# Patient Record
Sex: Male | Born: 1990 | Race: White | Hispanic: No | Marital: Single | Smoking: Never smoker
Health system: Southern US, Community
[De-identification: ages and names within clinical notes are randomized; demographics above are authoritative.]

## PROBLEM LIST (undated history)

## (undated) DIAGNOSIS — S62609A Fracture of unspecified phalanx of unspecified finger, initial encounter for closed fracture: Secondary | ICD-10-CM

## (undated) DIAGNOSIS — S82899A Other fracture of unspecified lower leg, initial encounter for closed fracture: Secondary | ICD-10-CM

## (undated) DIAGNOSIS — T7840XA Allergy, unspecified, initial encounter: Secondary | ICD-10-CM

## (undated) DIAGNOSIS — S060X9A Concussion with loss of consciousness of unspecified duration, initial encounter: Secondary | ICD-10-CM

## (undated) HISTORY — DX: Other fracture of unspecified lower leg, initial encounter for closed fracture: S82.899A

## (undated) HISTORY — DX: Fracture of unspecified phalanx of unspecified finger, initial encounter for closed fracture: S62.609A

## (undated) HISTORY — DX: Allergy, unspecified, initial encounter: T78.40XA

## (undated) HISTORY — DX: Concussion with loss of consciousness of unspecified duration, initial encounter: S06.0X9A

---
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
## 2012-06-18 ENCOUNTER — Ambulatory Visit (INDEPENDENT_AMBULATORY_CARE_PROVIDER_SITE_OTHER): Payer: BC Managed Care – PPO | Admitting: Family Medicine

## 2012-06-18 VITALS — BP 106/66 | HR 57 | Temp 97.9°F | Resp 16 | Ht 67.0 in | Wt 198.2 lb

## 2012-06-18 DIAGNOSIS — F988 Other specified behavioral and emotional disorders with onset usually occurring in childhood and adolescence: Secondary | ICD-10-CM

## 2012-06-18 NOTE — Progress Notes (Signed)
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

## 2012-09-15 IMAGING — CR DG FOOT 3+V UNILAT - NO REPORT
3 series · 3 of 3 positions shown · non-contrast
Comparison: none

[AP]
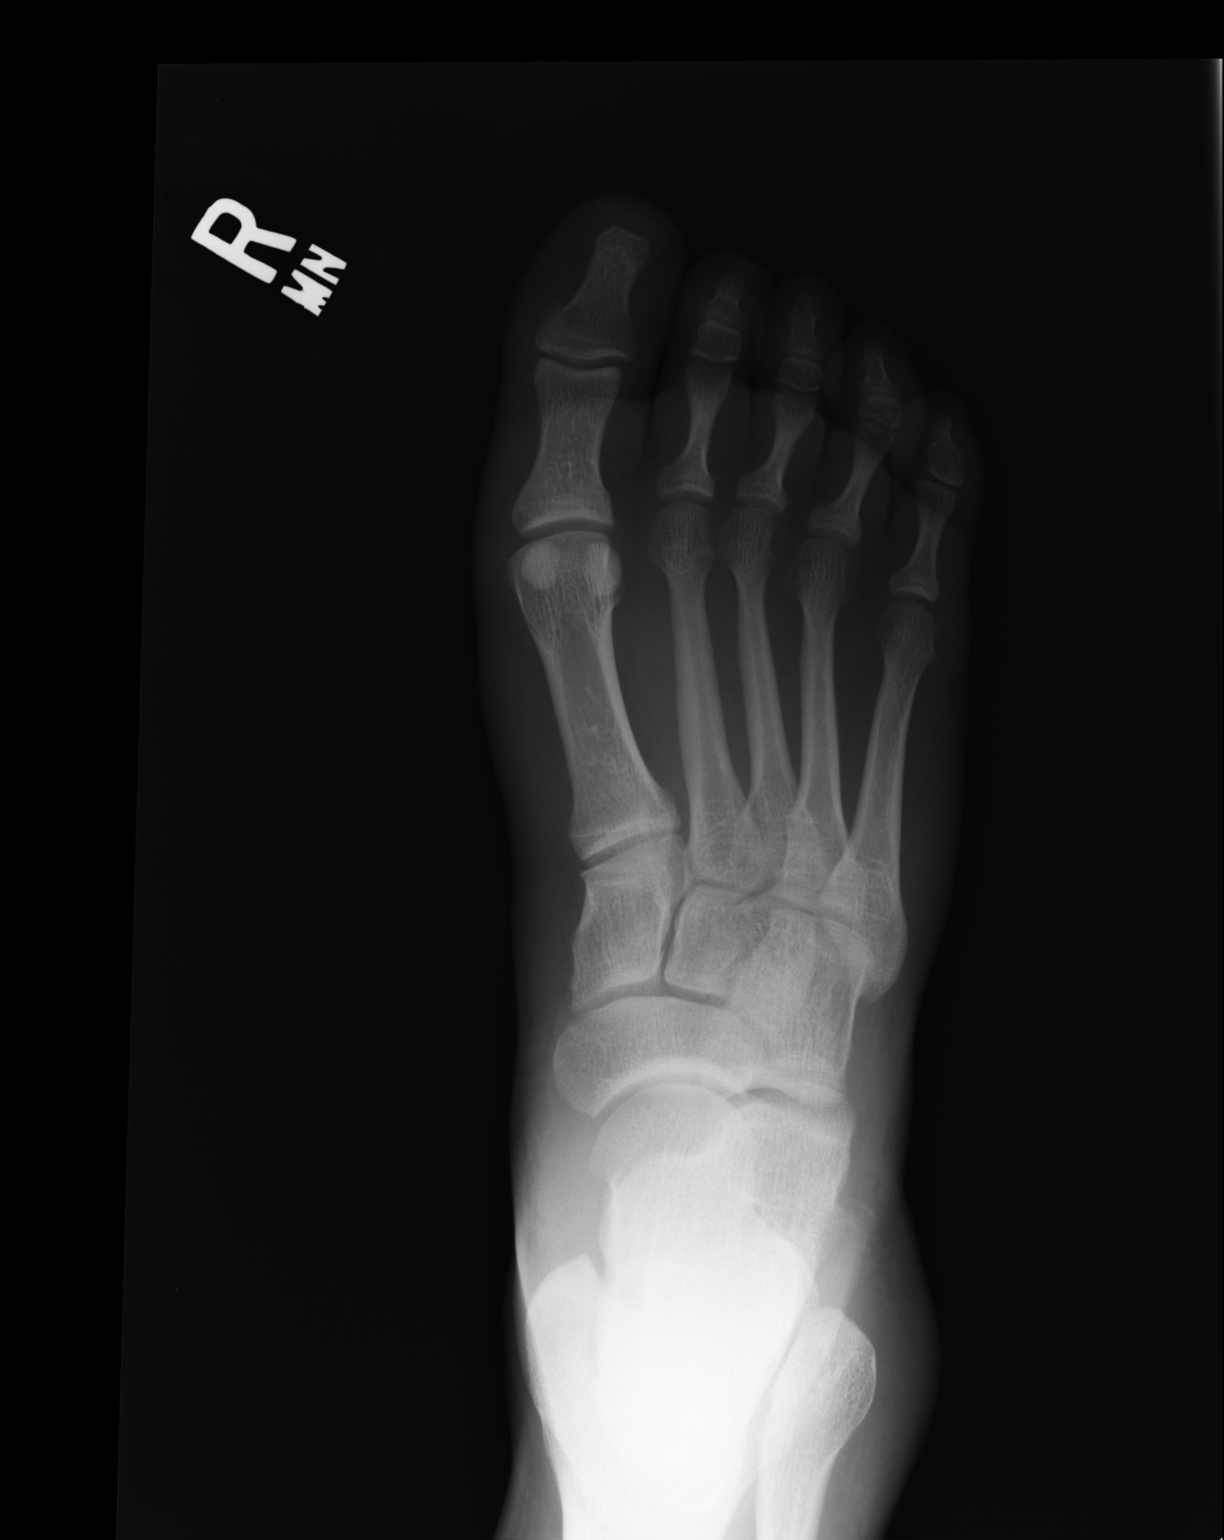

[ap obl int rot]
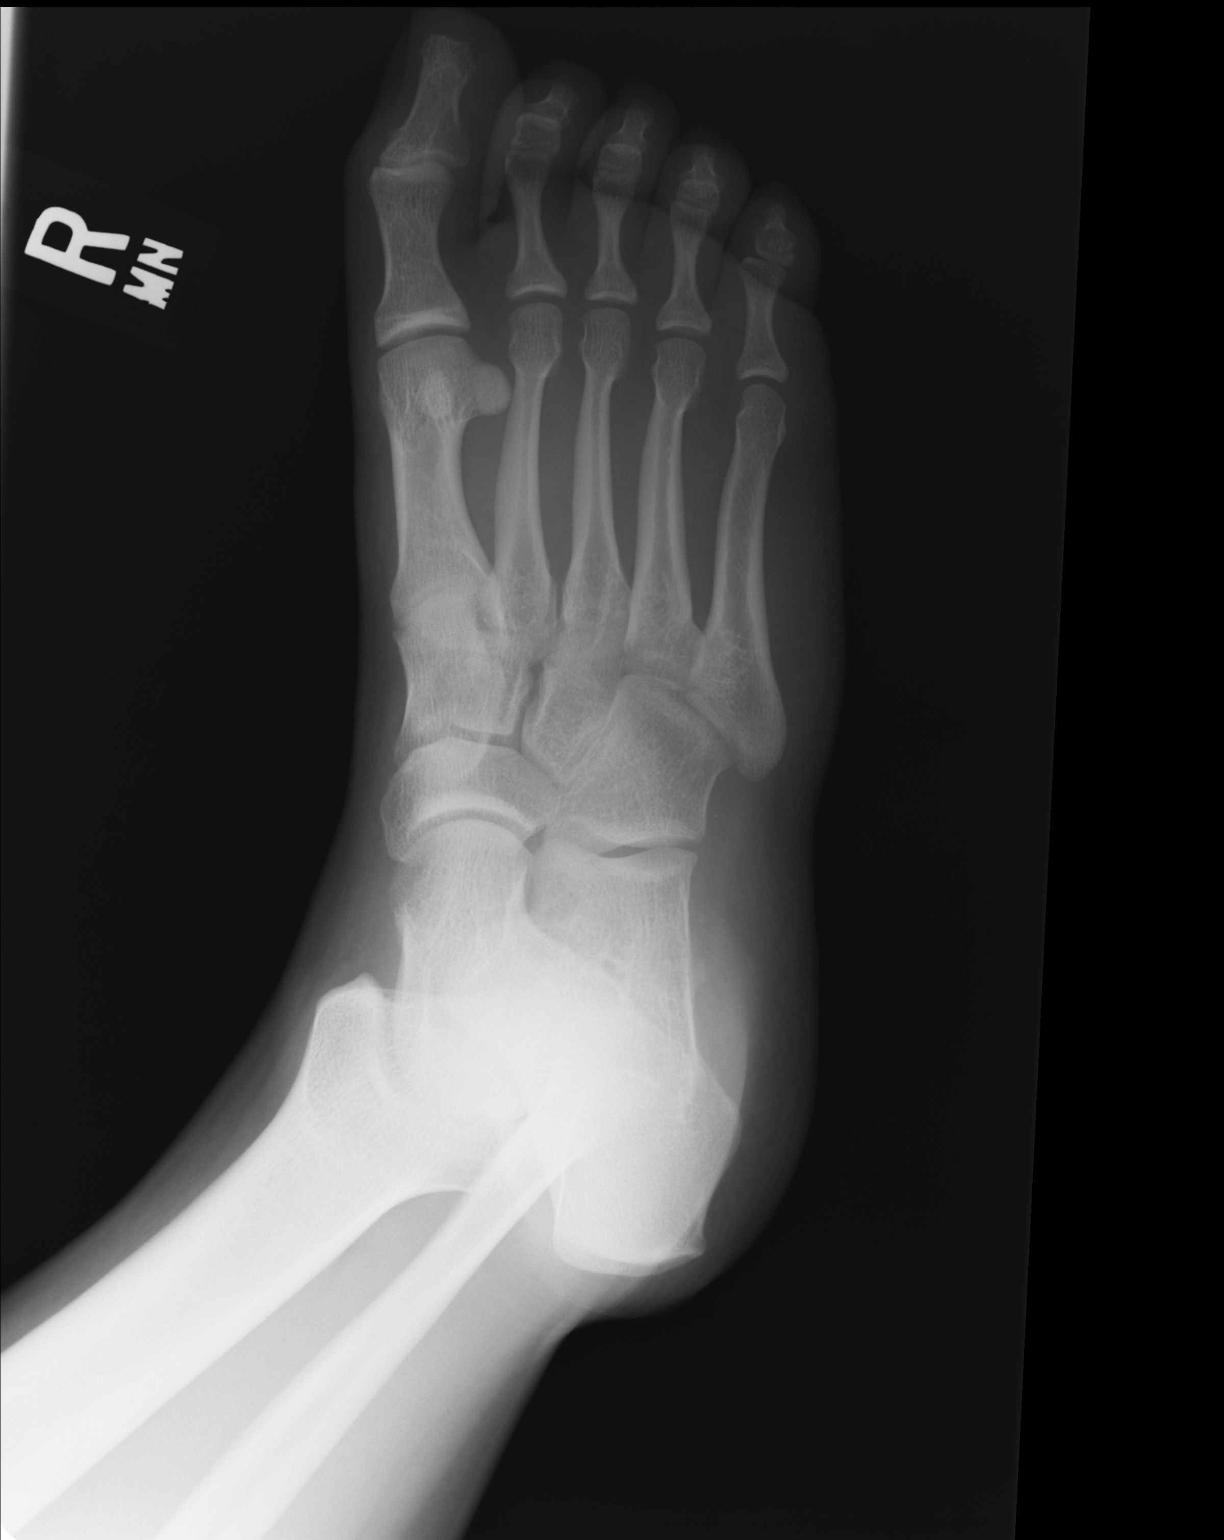

[lateral]
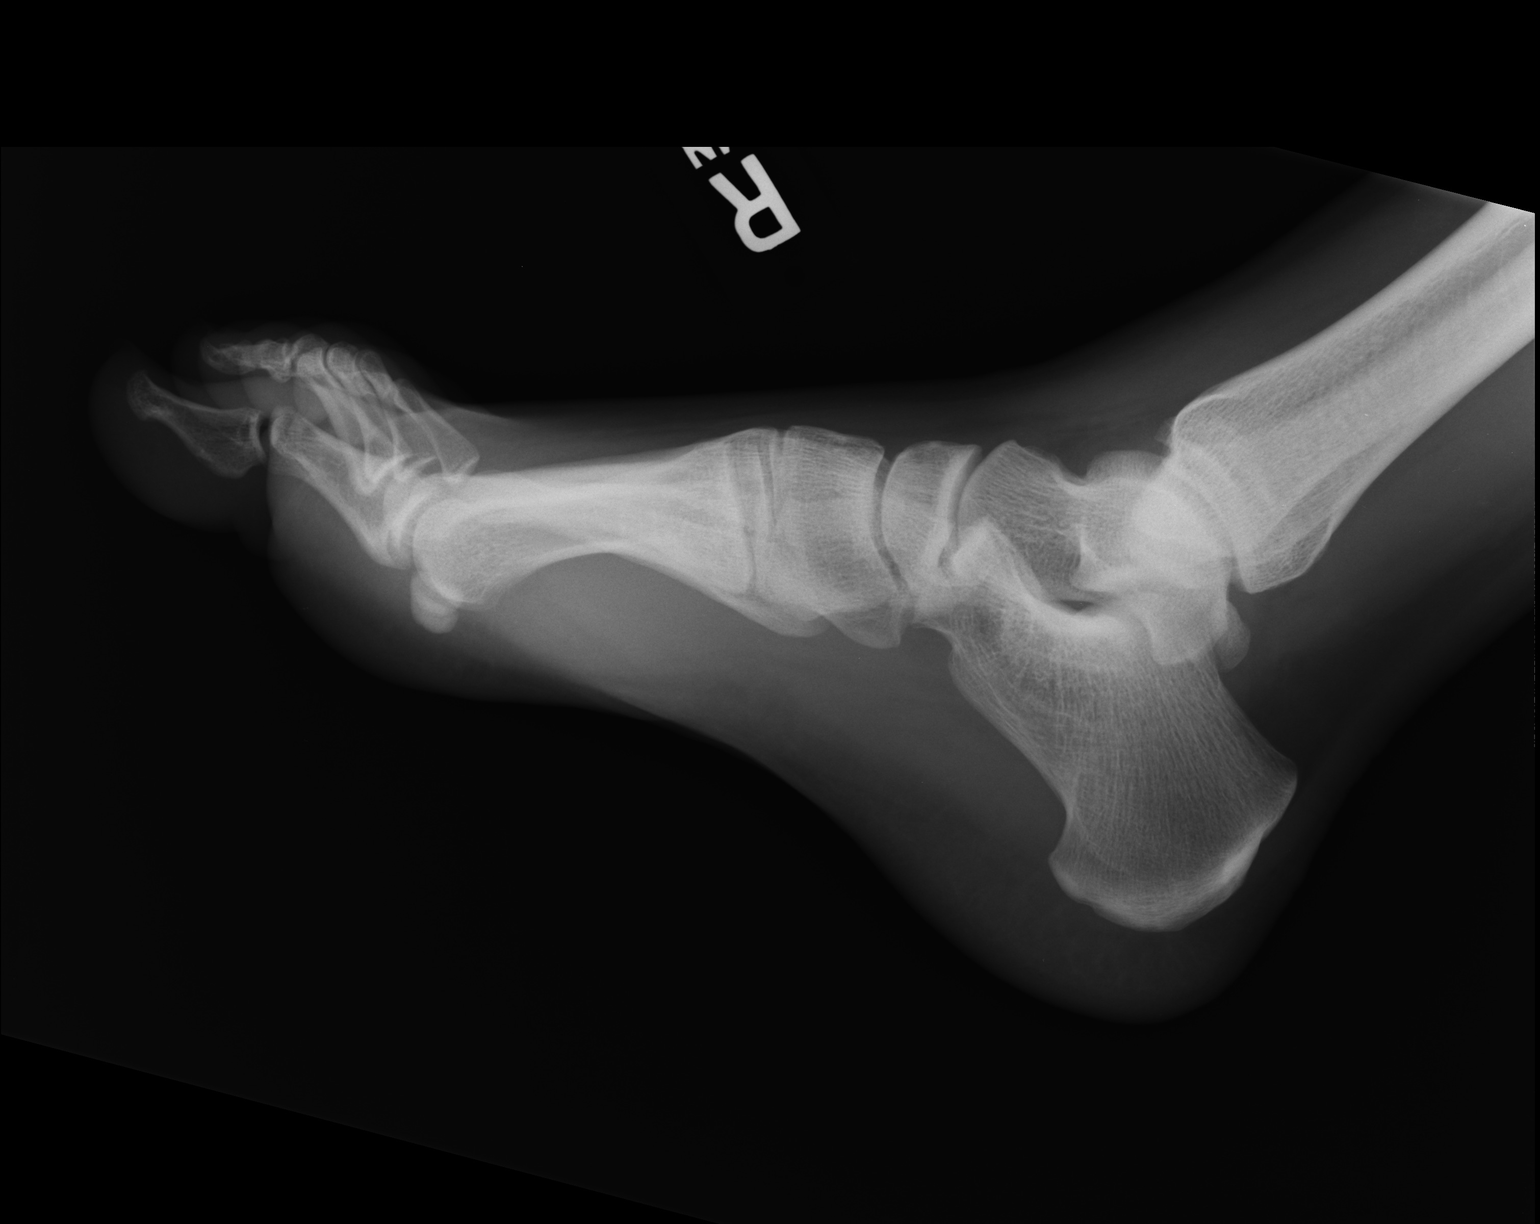

[3 of 3 positions shown; findings below may reference images not displayed]

Canned report from images found in remote index.

Refer to host system for actual result text.

## 2012-11-06 DIAGNOSIS — S62609A Fracture of unspecified phalanx of unspecified finger, initial encounter for closed fracture: Secondary | ICD-10-CM

## 2012-11-06 HISTORY — DX: Fracture of unspecified phalanx of unspecified finger, initial encounter for closed fracture: S62.609A

## 2017-05-14 ENCOUNTER — Encounter: Payer: Self-pay | Admitting: Family Medicine

## 2017-05-14 ENCOUNTER — Ambulatory Visit (INDEPENDENT_AMBULATORY_CARE_PROVIDER_SITE_OTHER): Payer: Managed Care, Other (non HMO) | Admitting: Family Medicine

## 2017-05-14 VITALS — BP 118/81 | HR 77 | Temp 98.2°F | Resp 16 | Ht 67.0 in | Wt 259.4 lb

## 2017-05-14 DIAGNOSIS — F4329 Adjustment disorder with other symptoms: Secondary | ICD-10-CM | POA: Diagnosis not present

## 2017-05-14 DIAGNOSIS — Z634 Disappearance and death of family member: Secondary | ICD-10-CM | POA: Diagnosis not present

## 2017-05-14 DIAGNOSIS — F4321 Adjustment disorder with depressed mood: Secondary | ICD-10-CM

## 2017-05-14 NOTE — Patient Instructions (Addendum)
Please call one of the numbers below for counseling as I think that most of your symptoms are due to adjustment disorder or with grief. [facility] another option for you or your family if you're interested. Additionally the employee assistance program through your work may also be an option if one is present. Disc golf once per week and some form of activity or exercise daily may also provide some benefit.   Follow-up with me in the next few weeks, sooner if any worsening of symptoms. If you're continuing to have difficulty with memory, focus, or concentration, we certainly can look into other testing or evaluation.     Karmen BongoAaron Stewart: [phone] [place] PedClaire Huprich: [phone]  Hospice: [email or web address]-[email or web address]-adults [phone]  Adjustment Disorder, Adult Adjustment disorder is a group of symptoms that can develop after a stressful life event, such as the loss of a job or serious physical illness. The symptoms can affect how you feel, think, and act. They may interfere with your relationships. Adjustment disorder increases your risk of suicide and substance abuse. If this disorder is not managed early, it can develop into a more serious condition, such as major depressive disorder or post-traumatic stress disorder. What are the causes? This condition happens when you have trouble recovering from or coping with a stressful life event. What increases the risk? You are more likely to develop this condition if:  You have had depression or anxiety.  You are being treated for a long-term (chronic) illness.  You are being treated for an illness that cannot be cured (terminal illness).  You have a family history of mental illness.  What are the signs or symptoms? Symptoms of this condition include:  Extreme trouble doing daily tasks, such as going to work.  Sadness, depression, or crying spells.  Worrying a lot.  Loss of enjoyment.  Change in appetite  or weight.  Feelings of loss or hopelessness.  Thoughts of suicide.  Anxiety, worry, or nervousness.  Trouble sleeping.  Avoiding family and friends.  Fighting or vandalism.  Complaining of feeling sick without being ill.  Feeling dazed or disconnected.  Nightmares.  Trouble sleeping.  Irritability.  Reckless driving.  Poor work International aid/development workerperformance.  Ignoring bills.  Symptoms of this condition start within three months of the stressful event. They do not last more than six months, unless the stressful circumstances last longer. Normal grieving after the death of a loved one is not a symptom of this condition. How is this diagnosed? To diagnose this condition, your health care provider will ask about what has happened in your life and how it has affected you. He or she may also ask about your medical history and your use of medicines, alcohol, and other substances. Your health care provider may do a physical exam and order lab tests or other studies. You may be referred to a mental health specialist. How is this treated? Treatment options for this condition include:  Counseling or talk therapy. Talk therapy is usually provided by mental health specialists.  Medicines. Certain medicines may help with depression, anxiety, and sleep.  Support groups. These offer emotional support, advice, and guidance. They are made up of people who have had similar experiences.  Observation and time. This is sometimes called "watchful waiting." In this treatment, health care providers monitor your health and behavior without other treatment. Adjustment disorder sometimes gets better on its own with time.  Follow these instructions at home:  Take over-the-counter and prescription medicines only as told by your health  care provider.  Keep all follow-up visits as told by your health care provider. This is important. Contact a health care provider if:  Your symptoms do not improve in six  months.  Your symptoms get worse. Get help right away if:  You have serious thoughts about hurting yourself or someone else. If you ever feel like you may hurt yourself or others, or have thoughts about taking your own life, get help right away. You can go to your nearest emergency department or call:  Your local emergency services (911 in the [place]  A suicide crisis helpline, such as the National Suicide Prevention Lifeline at 838-470-0687. This is open 24 hours a day.  Summary  Adjustment disorder is a group of symptoms that can develop after a stressful life event, such as the loss of a job or serious physical illness. The symptoms can affect how you feel, think, and act. They may interfere with your relationships.  Symptoms of this condition start within three months of the stressful event. They do not last more than six months, unless the stressful circumstances last longer.  Treatment may include talk therapy, medicines, participation in a support group, or observation to see if symptoms improve.  Contact your health care provider if your symptoms get worse or do not improve in six months.  If you ever feel like you may hurt yourself or others, or have thoughts about taking your own life, get help right away. This information is not intended to replace advice given to you by your health care provider. Make sure you discuss any questions you have with your health care provider. Document Released: 06/27/2006 Document Revised: 12/22/2016 Document Reviewed: 12/22/2016 Elsevier Interactive Patient Education  2018 [employer].   Complicated Grieving Grief is a normal response to the death of someone close to you. Feelings of fear, anger, and guilt can affect almost everyone who loses a loved one. It is also common to have symptoms of depression while you are grieving. These include problems with sleep, loss of appetite, and lack of energy. They may last for weeks or months after a  loss. Complicated grief is different from normal grief or depression. Normal grieving involves sadness and feelings of loss, but these feelings are not constant. Complicated grief is a constant and severe type of grief. It interferes with your ability to function normally. It may last for several months to a year or longer. Complicated grief may require treatment from a mental health care provider. What are the causes? It is not known why some people continue to struggle with grief and others do not. You may be at higher risk for complicated grief if:  The death of your loved one was sudden or unexpected.  The death of your loved one was due to a violent event.  Your loved one committed suicide.  Your loved one was a child or a young person.  You were very close to or dependent on the loved one.  You have a history of depression.  What are the signs or symptoms? Signs and symptoms of complicated grief may include:  Feeling disbelief or numbness.  Being unable to enjoy good memories of your loved one.  Needing to avoid anything that reminds you of your loved one.  Being unable to stop thinking about the death.  Feeling intense anger or guilt.  Feeling alone and hopeless.  Feeling that your life is meaningless and empty.  Losing the desire to live.  How is this diagnosed?  Your health care provider may diagnose complicated grief if:  You have constant symptoms of grief for 6-12 months or longer.  Your symptoms are interfering with your ability to live your life.  Your health care provider may want you to see a mental health care provider. Many symptoms of depression are similar to the symptoms of complicated grief. It is important to be evaluated for complicated grief along with other mental health conditions. How is this treated? Talk therapy with a mental health provider is the most common treatment for complicated grief. During therapy, you will learn healthy ways to cope  with the loss of your loved one. In some cases, your mental health care provider may also recommend antidepressant medicines. Follow these instructions at home:  Take care of yourself. ? Eat regular meals and maintain a healthy diet. Eat plenty of fruits, vegetables, and whole grains. ? Try to get some exercise each day. ? Keep regular hours for sleep. Try to get at least 8 hours of sleep each night.  Do not use drugs or alcohol to ease your symptoms.  Take medicines only as directed by your health care provider.  Spend time with friends and loved ones.  Consider joining a grief (bereavement) support group to help you deal with your loss.  Keep all follow-up visits as directed by your health care provider. This is important. Contact a health care provider if:  Your symptoms keep you from functioning normally.  Your symptoms do not get better with treatment. Get help right away if:  You have serious thoughts of hurting yourself or someone else.  You have suicidal feelings. This information is not intended to replace advice given to you by your health care provider. Make sure you discuss any questions you have with your health care provider. Document Released: 10/23/2005 Document Revised: 03/30/2016 Document Reviewed: 04/02/2014 Elsevier Interactive Patient Education  2018 [employer].   IF you received an x-ray today, you will receive an invoice from [facility]. Please contact [facility] at [phone] with questions or concerns regarding your invoice.   IF you received labwork today, you will receive an invoice from [facility]. Please contact [facility] at [phone] with questions or concerns regarding your invoice.   Our billing staff will not be able to assist you with questions regarding bills from these companies.  You will be contacted with the lab results as soon as they are available. The fastest way to get your results is to activate your My Chart  account. Instructions are located on the last page of this paperwork. If you have not heard from [place] regarding the results in 2 weeks, please contact this office.

## 2017-05-14 NOTE — Progress Notes (Addendum)
Subjective:  By signing my name below, I, Essence Howell, attest that this documentation has been prepared under the direction and in the presence of Wendie Agreste, MD Electronically Signed: Ladene Artist, ED Scribe 05/14/2017 at 5:45 PM.   Patient ID: Aaron Cox, male    DOB: [birth year], 26 y.o.   MRN: [id number]  Chief Complaint  Patient presents with  . Memory Loss    since the passing of his father and grandfather (08/2016 and 03/2017); reports losses of time, sometimes up to an hour  . Depression   HPI  Aaron Cox is a 26 y.o. male who presents to Primary Care at [facility] complaining of decreased concentration over the past 3 months. Pt states that he occasionally forgets what he wants to say in the middle of conversations. He reports increased stress and anxiety since the unexpected passing of his father in 08/2016 and grandfather in 03/2017. Pt states that he has never met with a grief counselor but is open to it. He reports some depression and SI for a few months in high school with occasional depression lasting up to 1 week after highschool. Pt states that he talked through suicidal thoughts in highschool with his girlfriend at the time and symptoms resolved. He denies SI/HI at this time. States he does not think he is truly depressed at this time, however, he states that he feels sad now only when speaking about the passing of his father. States he never had the chance to grieve his father's death since his grandfather became ill soon after and had to come live with him and his mother until he passed in May. Pt lives alone and does have firearms without ammunition in his home that he inherited from his grandfather. Denies sleep disturbances; reports 6-7 hours/night. Has been eating more recently and has noticed recent weight gain. He is not currently exercising. Denies increased alcohol consumption; 1 drink/month. Denies tobacco use or illicit drug use. Pt's mother is currently  taking anti-depressants and has mentioned meeting with a specialist as well. 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 He reports difficulty concentrating at work but does not suspect that his supervisors have noticed. He has adjusted/compensated by taking more notes. Pt is no longer taking Adderall; stopped in 2015. Had been doing fine with his concentration and focus prior to recent deaths as above. Pt denies slurred speech, weakness and any other focal neurologic symptoms.  Depression screen PHQ 2/9 05/14/2017  Decreased Interest 0  Down, Depressed, Hopeless 1  PHQ - 2 Score 1  Altered sleeping 0  Tired, decreased energy 0  Change in appetite 2  Feeling bad or failure about yourself  3  Trouble concentrating 3  Moving slowly or fidgety/restless 0  Suicidal thoughts 0  PHQ-9 Score 9  Difficult doing work/chores Very difficult   There are no active problems to display for this patient.  Past Medical History:  Diagnosis Date  . Allergy    seasonal  . Concussion    age 23  . Finger fracture, left 2014   pinkie finger, L  . Fracture, ankle 2013   No past surgical history on file. No Known Allergies Prior to Admission medications   Medication Sig Start Date End Date Taking? Authorizing Provider  amphetamine-dextroamphetamine (ADDERALL) 20 MG tablet Take 20 mg by mouth daily.    [provider]  amphetamine-dextroamphetamine (ADDERALL) 5 MG tablet Take 5 mg by mouth daily.    [provider]   Social History   Social History  . Marital status: Single    Spouse name: N/A  . Number of children: N/A  . Years of education: N/A   Occupational History  . Not on file.   Social History Main Topics  . Smoking status: Never Smoker  . Smokeless tobacco: Never Used  . Alcohol use Yes     Comment: occasional  . Drug use: No  . Sexual activity: Not on file   Other Topics Concern  . Not on file   Social History Narrative  . No narrative on file    Review of Systems  Constitutional: Positive for appetite change.  Neurological: Negative for speech difficulty and weakness.  Psychiatric/Behavioral: Positive for decreased concentration and dysphoric mood. Negative for sleep disturbance and suicidal ideas.      Objective:   Physical Exam  Constitutional: He is oriented to person, place, and time. He appears well-developed and well-nourished. No distress.  HENT:  Head: Normocephalic and atraumatic.  Eyes: Conjunctivae and EOM are normal.  Neck: Neck supple. No tracheal deviation present.  Cardiovascular: Normal rate.   Pulmonary/Chest: Effort normal. No respiratory distress.  Musculoskeletal: Normal range of motion.  Neurological: He is alert and oriented to person, place, and time. He displays a negative Romberg sign.  Serial 7s normal. Months of the year backwards normal. No pronator drift. Recall 3/3.  Skin: Skin is warm and dry.  Psychiatric: He has a normal mood and affect. His behavior is normal.  Nursing note and vitals reviewed.  Vitals:   05/14/17 1728  BP: 118/81  Pulse: 77  Resp: 16  Temp: 98.2 F (36.8 C)  TempSrc: Oral  SpO2: 96%  Weight: 259 lb 6.4 oz (117.7 kg)  Height: _0  (1.702 m)      Assessment & Plan:   Aaron Cox is a 26 y.o. male Complicated grief Adjustment disorder with depressed mood  - Prior history of depression in high school, but appears to primarily be adjustment disorder/complicated grief with depressed mood at this time. Denies suicidal ideation. I suspect some of his symptoms are due to insufficient time in grieving his father's death as grandfather's health immediately became an issue after the passing of his father.  - Medication discussed, but deferred at this time.  - Phone numbers provided for counseling, importance of counseling and grieving process were discussed. Hospice phone numbers also provided for group counseling or individual counseling  - Exercise/some activity  outside of the house every day was recommended, including disc golf.  - Handout given on adjustment disorder and complicated grief.  - Recheck in 3-4 weeks, sooner if worse.  No orders of the defined types were placed in this encounter.  Patient Instructions   Please call one of the numbers below for counseling as I think that most of your symptoms are due to adjustment disorder or with grief. [facility] is another option for you or your family if you're interested. Additionally the employee assistance program through your work may also be an option if one is present. Disc golf once per week and some form of activity or exercise daily may also provide some benefit.   Follow-up with me in the next few weeks, sooner if any worsening of symptoms. If you're continuing to have difficulty with memory, focus, or concentration, we certainly can look into other testing or evaluation.     Vivia Budge: [phone] Arvil Chaco: [phone]  Hospice: https://www.[facility].org/our-services/support-for-adults 603-031-9506  Adjustment Disorder,  Adult Adjustment disorder is a group of symptoms that can develop after a stressful life event, such as the loss of a job or serious physical illness. The symptoms can affect how you feel, think, and act. They may interfere with your relationships. Adjustment disorder increases your risk of suicide and substance abuse. If this disorder is not managed early, it can develop into a more serious condition, such as major depressive disorder or post-traumatic stress disorder. What are the causes? This condition happens when you have trouble recovering from or coping with a stressful life event. What increases the risk? You are more likely to develop this condition if:  You have had depression or anxiety.  You are being treated for a long-term (chronic) illness.  You are being treated for an illness that cannot be cured (terminal illness).  You have a  family history of mental illness.  What are the signs or symptoms? Symptoms of this condition include:  Extreme trouble doing daily tasks, such as going to work.  Sadness, depression, or crying spells.  Worrying a lot.  Loss of enjoyment.  Change in appetite or weight.  Feelings of loss or hopelessness.  Thoughts of suicide.  Anxiety, worry, or nervousness.  Trouble sleeping.  Avoiding family and friends.  Fighting or vandalism.  Complaining of feeling sick without being ill.  Feeling dazed or disconnected.  Nightmares.  Trouble sleeping.  Irritability.  Reckless driving.  Poor work Systems analyst.  Ignoring bills.  Symptoms of this condition start within three months of the stressful event. They do not last more than six months, unless the stressful circumstances last longer. Normal grieving after the death of a loved one is not a symptom of this condition. How is this diagnosed? To diagnose this condition, your health care provider will ask about what has happened in your life and how it has affected you. He or she may also ask about your medical history and your use of medicines, alcohol, and other substances. Your health care provider may do a physical exam and order lab tests or other studies. You may be referred to a mental health specialist. How is this treated? Treatment options for this condition include:  Counseling or talk therapy. Talk therapy is usually provided by mental health specialists.  Medicines. Certain medicines may help with depression, anxiety, and sleep.  Support groups. These offer emotional support, advice, and guidance. They are made up of people who have had similar experiences.  Observation and time. This is sometimes called "watchful waiting." In this treatment, health care providers monitor your health and behavior without other treatment. Adjustment disorder sometimes gets better on its own with time.  Follow these instructions at  home:  Take over-the-counter and prescription medicines only as told by your health care provider.  Keep all follow-up visits as told by your health care provider. This is important. Contact a health care provider if:  Your symptoms do not improve in six months.  Your symptoms get worse. Get help right away if:  You have serious thoughts about hurting yourself or someone else. If you ever feel like you may hurt yourself or others, or have thoughts about taking your own life, get help right away. You can go to your nearest emergency department or call:  Your local emergency services (911 in the [place]  A suicide crisis helpline, such as the [place] at (313) 089-8685. This is open 24 hours a day.  Summary  Adjustment disorder is a group of symptoms  that can develop after a stressful life event, such as the loss of a job or serious physical illness. The symptoms can affect how you feel, think, and act. They may interfere with your relationships.  Symptoms of this condition start within three months of the stressful event. They do not last more than six months, unless the stressful circumstances last longer.  Treatment may include talk therapy, medicines, participation in a support group, or observation to see if symptoms improve.  Contact your health care provider if your symptoms get worse or do not improve in six months.  If you ever feel like you may hurt yourself or others, or have thoughts about taking your own life, get help right away. This information is not intended to replace advice given to you by your health care provider. Make sure you discuss any questions you have with your health care provider. Document Released: 06/27/2006 Document Revised: 12/22/2016 Document Reviewed: 12/22/2016 Elsevier Interactive Patient Education  2018 [employer].   Complicated Grieving Grief is a normal response to the death of someone close to you. Feelings of  fear, anger, and guilt can affect almost everyone who loses a loved one. It is also common to have symptoms of depression while you are grieving. These include problems with sleep, loss of appetite, and lack of energy. They may last for weeks or months after a loss. Complicated grief is different from normal grief or depression. Normal grieving involves sadness and feelings of loss, but these feelings are not constant. Complicated grief is a constant and severe type of grief. It interferes with your ability to function normally. It may last for several months to a year or longer. Complicated grief may require treatment from a mental health care provider. What are the causes? It is not known why some people continue to struggle with grief and others do not. You may be at higher risk for complicated grief if:  The death of your loved one was sudden or unexpected.  The death of your loved one was due to a violent event.  Your loved one committed suicide.  Your loved one was a child or a young person.  You were very close to or dependent on the loved one.  You have a history of depression.  What are the signs or symptoms? Signs and symptoms of complicated grief may include:  Feeling disbelief or numbness.  Being unable to enjoy good memories of your loved one.  Needing to avoid anything that reminds you of your loved one.  Being unable to stop thinking about the death.  Feeling intense anger or guilt.  Feeling alone and hopeless.  Feeling that your life is meaningless and empty.  Losing the desire to live.  How is this diagnosed? Your health care provider may diagnose complicated grief if:  You have constant symptoms of grief for 6-12 months or longer.  Your symptoms are interfering with your ability to live your life.  Your health care provider may want you to see a mental health care provider. Many symptoms of depression are similar to the symptoms of complicated grief. It is  important to be evaluated for complicated grief along with other mental health conditions. How is this treated? Talk therapy with a mental health provider is the most common treatment for complicated grief. During therapy, you will learn healthy ways to cope with the loss of your loved one. In some cases, your mental health care provider may also recommend antidepressant medicines. Follow these instructions  at home:  Take care of yourself. ? Eat regular meals and maintain a healthy diet. Eat plenty of fruits, vegetables, and whole grains. ? Try to get some exercise each day. ? Keep regular hours for sleep. Try to get at least 8 hours of sleep each night.  Do not use drugs or alcohol to ease your symptoms.  Take medicines only as directed by your health care provider.  Spend time with friends and loved ones.  Consider joining a grief (bereavement) support group to help you deal with your loss.  Keep all follow-up visits as directed by your health care provider. This is important. Contact a health care provider if:  Your symptoms keep you from functioning normally.  Your symptoms do not get better with treatment. Get help right away if:  You have serious thoughts of hurting yourself or someone else.  You have suicidal feelings. This information is not intended to replace advice given to you by your health care provider. Make sure you discuss any questions you have with your health care provider. Document Released: 10/23/2005 Document Revised: 03/30/2016 Document Reviewed: 04/02/2014 Elsevier Interactive Patient Education  2018 [employer].   IF you received an x-ray today, you will receive an invoice from [facility]. Please contact [facility] at [phone] with questions or concerns regarding your invoice.   IF you received labwork today, you will receive an invoice from [facility]. Please contact [facility] at [phone] with questions or concerns regarding  your invoice.   Our billing staff will not be able to assist you with questions regarding bills from these companies.  You will be contacted with the lab results as soon as they are available. The fastest way to get your results is to activate your My Chart account. Instructions are located on the last page of this paperwork. If you have not heard from [place] regarding the results in 2 weeks, please contact this office.       I personally performed the services described in this documentation, which was scribed in my presence. The recorded information has been reviewed and considered for accuracy and completeness, addended by me as needed, and agree with information above.  Signed,   Merri Ray, MD [facility].  05/14/17 6:19 PM

## 2018-11-25 ENCOUNTER — Ambulatory Visit
Admission: EM | Admit: 2018-11-25 | Discharge: 2018-11-25 | Disposition: A | Discharge status: Home / Self Care | Payer: BLUE CROSS/BLUE SHIELD | Attending: Physician Assistant | Admitting: Physician Assistant

## 2018-11-25 DIAGNOSIS — R0602 Shortness of breath: Secondary | ICD-10-CM | POA: Diagnosis not present

## 2018-11-25 MED ORDER — IPRATROPIUM-ALBUTEROL 0.5-2.5 (3) MG/3ML IN SOLN
3.0000 mL | Freq: Once | RESPIRATORY_TRACT | Status: AC
  Administered 2018-11-25: 3 mL via RESPIRATORY_TRACT

## 2018-11-25 MED ORDER — ALBUTEROL SULFATE HFA 108 (90 BASE) MCG/ACT IN AERS: 1.0000 | INHALATION_SPRAY | Freq: Four times a day (QID) | RESPIRATORY_TRACT | 0 refills | Status: AC | PRN

## 2018-11-25 NOTE — Discharge Instructions (Signed)
Return if any problems.

## 2018-11-25 NOTE — ED Triage Notes (Signed)
Pt c/o SOB and wheezing while taking a deep breath after helping his mom move a rug yesterday. States allergic to dogs and a dogs stays on that rug. No distress noted, speaking in complete sentences.

## 2018-11-27 NOTE — ED Provider Notes (Signed)
[facility]    CSN: [id number] Arrival date & time: 11/25/18  1349     History   Chief Complaint Chief Complaint  Patient presents with  . Shortness of Breath    HPI Aaron Cox is a 28 y.o. male.   The history is provided by the patient. No language interpreter was used.  Shortness of Breath  Severity:  Moderate Onset quality:  Gradual Timing:  Constant Progression:  Worsening Chronicity:  New Context: activity   Relieved by:  Nothing Ineffective treatments:  None tried Associated symptoms: cough     Past Medical History:  Diagnosis Date  . Allergy    seasonal  . Concussion    age 28  . Finger fracture, left 2014   pinkie finger, L  . Fracture, ankle 2013    There are no active problems to display for this patient.   History reviewed. No pertinent surgical history.     Home Medications    Prior to Admission medications   Medication Sig Start Date End Date Taking? Authorizing Provider  albuterol (PROVENTIL HFA;VENTOLIN HFA) 108 (90 Base) MCG/ACT inhaler Inhale 1-2 puffs into the lungs every 6 (six) hours as needed for wheezing or shortness of breath. 11/25/18   Elson AreasSofia, Chonita Gadea K, PA-C    Family History Family History  Problem Relation Age of Onset  . Heart disease Maternal Grandfather     Social History Social History   Tobacco Use  . Smoking status: Never Smoker  . Smokeless tobacco: Never Used  Substance Use Topics  . Alcohol use: Yes    Comment: occasional  . Drug use: No     Allergies   Patient has no known allergies.   Review of Systems Review of Systems  Respiratory: Positive for cough and shortness of breath.   All other systems reviewed and are negative.    Physical Exam Triage Vital Signs ED Triage Vitals [11/25/18 1509]  Enc Vitals Group     BP 128/88     Pulse Rate 98     Resp 18     Temp 97.9 F (36.6 C)     Temp Source Oral     SpO2 95 %     Weight      Height      Head Circumference    Peak Flow      Pain Score 0     Pain Loc      Pain Edu?      Excl. in GC?    No data found.  Updated Vital Signs BP 128/88 (BP Location: Left Arm)   Pulse 98   Temp 97.9 F (36.6 C) (Oral)   Resp 18   SpO2 95%   Visual Acuity Right Eye Distance:   Left Eye Distance:   Bilateral Distance:    Right Eye Near:   Left Eye Near:    Bilateral Near:     Physical Exam Vitals signs and nursing note reviewed.  Constitutional:      Appearance: He is well-developed.  HENT:     Head: Normocephalic and atraumatic.  Eyes:     Conjunctiva/sclera: Conjunctivae normal.  Neck:     Musculoskeletal: Neck supple.  Cardiovascular:     Rate and Rhythm: Normal rate and regular rhythm.     Heart sounds: No murmur.  Pulmonary:     Effort: Pulmonary effort is normal. No respiratory distress.     Breath sounds: Examination of the right-upper field reveals wheezing. Examination of  the left-upper field reveals wheezing. Wheezing present.  Abdominal:     Palpations: Abdomen is soft.     Tenderness: There is no abdominal tenderness.  Musculoskeletal: Normal range of motion.  Skin:    General: Skin is warm and dry.  Neurological:     General: No focal deficit present.     Mental Status: He is alert.      UC Treatments / Results  Labs (all labs ordered are listed, but only abnormal results are displayed) Labs Reviewed - No data to display  EKG None  Radiology No results found.  Procedures Procedures (including critical care time)  Medications Ordered in UC Medications  ipratropium-albuterol (DUONEB) 0.5-2.5 (3) MG/3ML nebulizer solution 3 mL (3 mLs Nebulization Given 11/25/18 1540)    Initial Impression / Assessment and Plan / UC Course  I have reviewed the triage vital signs and the nursing notes.  Pertinent labs & imaging results that were available during my care of the patient were reviewed by me and considered in my medical decision making (see chart for details).      MDM  Pt reports feeling better after breathing treatment.  Pt given rx for albuterol inhaler Final Clinical Impressions(s) / UC Diagnoses   Final diagnoses:  Shortness of breath     Discharge Instructions     Return if any problems.    ED Prescriptions    Medication Sig Dispense Auth. Provider   albuterol (PROVENTIL HFA;VENTOLIN HFA) 108 (90 Base) MCG/ACT inhaler Inhale 1-2 puffs into the lungs every 6 (six) hours as needed for wheezing or shortness of breath. 1 Inhaler Elson AreasSofia, Anner Baity K, [place]PA-C     Controlled Substance Prescriptions [place] Controlled Substance Registry consulted? Not Applicable  An After Visit Summary was printed and given to the patient.    Elson AreasSofia, Maritta Kief K, [place]-C 11/27/18 [id number]
